# Patient Record
Sex: Female | Born: 1990 | Race: Black or African American | Hispanic: No | Marital: Single | Smoking: Never smoker
Health system: Southern US, Community
[De-identification: ages and names within clinical notes are randomized; demographics above are authoritative.]

---
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
## 2014-01-27 ENCOUNTER — Emergency Department (HOSPITAL_COMMUNITY): Payer: 59

## 2014-01-27 ENCOUNTER — Emergency Department (HOSPITAL_COMMUNITY)
Admission: EM | Admit: 2014-01-27 | Discharge: 2014-01-27 | Disposition: A | Discharge status: Home / Self Care | Payer: 59 | Attending: Emergency Medicine | Admitting: Emergency Medicine

## 2014-01-27 ENCOUNTER — Encounter (HOSPITAL_COMMUNITY): Payer: Self-pay | Admitting: Emergency Medicine

## 2014-01-27 DIAGNOSIS — R0602 Shortness of breath: Secondary | ICD-10-CM | POA: Diagnosis not present

## 2014-01-27 DIAGNOSIS — R0789 Other chest pain: Secondary | ICD-10-CM | POA: Insufficient documentation

## 2014-01-27 DIAGNOSIS — M25569 Pain in unspecified knee: Secondary | ICD-10-CM | POA: Diagnosis not present

## 2014-01-27 DIAGNOSIS — R079 Chest pain, unspecified: Secondary | ICD-10-CM | POA: Insufficient documentation

## 2014-01-27 LAB — I-STAT CHEM 8, ED
BUN: 8 mg/dL (ref 6–23)
Calcium, Ion: 1.24 mmol/L — ABNORMAL HIGH (ref 1.12–1.23)
Chloride: 104 mEq/L (ref 96–112)
Creatinine, Ser: 0.8 mg/dL (ref 0.50–1.10)
Glucose, Bld: 91 mg/dL (ref 70–99)
HCT: 42 % (ref 36.0–46.0)
Hemoglobin: 14.3 g/dL (ref 12.0–15.0)
Potassium: 3.8 mEq/L (ref 3.7–5.3)
Sodium: 141 mEq/L (ref 137–147)
TCO2: 23 mmol/L (ref 0–100)

## 2014-01-27 LAB — D-DIMER, QUANTITATIVE: D-Dimer, Quant: <0.27 ug/mL-FEU (ref 0.00–0.48)

## 2014-01-27 MED ORDER — ACETAMINOPHEN 325 MG PO TABS
975.0000 mg | ORAL_TABLET | Freq: Once | ORAL | Status: AC
  Administered 2014-01-27: 975 mg via ORAL
  Filled 2014-01-27: qty 3

## 2014-01-27 NOTE — ED Provider Notes (Signed)
CSN: [id number]     Arrival date & time 01/27/14  1715 History   First MD Initiated Contact with Patient 01/27/14 1907     Chief Complaint  Patient presents with  . Pain     (Consider location/radiation/quality/duration/timing/severity/associated sxs/prior Treatment) HPI Complaint of anterior chest pain left-sided parasternal pleuritic in quality nonradiating worse with standing improved with sitting down  onset 4 PM today. Associated symptoms include shortness of breath. Also complains of right knee pain anterior nonradiating worse with moving her leg intermittent since February 2015. She treated herself with ibuprofen today with partial relief. Knee pain and chest pain are now mild. No fever. No cough. No other associated symptoms. She denies back pain.  History reviewed. No pertinent past medical history. History reviewed. No pertinent past surgical history. No family history on file. History  Substance Use Topics  . Smoking status: Never Smoker   . Smokeless tobacco: Not on file  . Alcohol Use: Yes     Comment: Occ   no illicit drug OB History   Grav Para Term Preterm Abortions TAB SAB Ect Mult Living                 Review of Systems  Constitutional: Negative.   HENT: Negative.   Respiratory: Positive for shortness of breath.   Cardiovascular: Positive for chest pain.  Gastrointestinal: Negative.   Musculoskeletal: Positive for arthralgias.       Right knee pain  Skin: Negative.   Neurological: Negative.   Psychiatric/Behavioral: Negative.   All other systems reviewed and are negative.     Allergies  Review of patient's allergies indicates no known allergies.  Home Medications   Prior to Admission medications   Medication Sig Start Date End Date Taking? Authorizing Provider  ibuprofen (ADVIL,MOTRIN) 200 MG tablet Take 200 mg by mouth every 6 (six) hours as needed for mild pain or moderate pain.   Yes Historical Provider, MD  medroxyPROGESTERone (DEPO-PROVERA)  150 MG/ML injection  11/04/13  Yes Historical Provider, MD   BP 117/67  Pulse 81  Temp(Src) 97.5 F (36.4 C) (Oral)  Resp 16  Ht 5\' 3"  (1.6 m)  Wt 125 lb (56.7 kg)  BMI 22.15 kg/m2  SpO2 100%  LMP 01/13/2014 Physical Exam  Nursing note and vitals reviewed. Constitutional: She appears well-developed and well-nourished.  HENT:  Head: Normocephalic and atraumatic.  Eyes: Conjunctivae are normal. Pupils are equal, round, and reactive to light.  Neck: Neck supple. No tracheal deviation present. No thyromegaly present.  Cardiovascular: Normal rate, regular rhythm and normal heart sounds.  Exam reveals no friction rub.   No murmur heard. Pulmonary/Chest: Effort normal and breath sounds normal.  Left chest wall is tender, reproducing pain exactly. Chest Pain is also easily reproduced with forcible abduction of left shoulder  Abdominal: Soft. Bowel sounds are normal. She exhibits no distension. There is no tenderness.  Musculoskeletal: Normal range of motion. She exhibits no edema and no tenderness.  All 4 extremities no redness swelling or tenderness neurovascularly intact  Neurological: She is alert. Coordination normal.  Skin: Skin is warm and dry. No rash noted.  Psychiatric: She has a normal mood and affect.    ED Course  Procedures (including critical care time) Labs Review Labs Reviewed - No data to display  Imaging Review Dg Chest 2 View  01/27/2014   CLINICAL DATA:  Chest pain.  EXAM: CHEST  2 VIEW  COMPARISON:  None.  FINDINGS: The heart size and mediastinal contours are within  normal limits. Both lungs are clear. The visualized skeletal structures are unremarkable.  IMPRESSION: Normal chest x-ray.   Electronically Signed   By: Loralie Champagne M.D.   On: 01/27/2014 18:08     EKG Interpretation   Date/Time:  Wednesday January 27 2014 17:41:28 EDT Ventricular Rate:  77 PR Interval:  148 QRS Duration: 86 QT Interval:  364 QTC Calculation: 411 R Axis:   76 Text  Interpretation:  Normal sinus rhythm with sinus arrhythmia Normal ECG  No old tracing to compare Confirmed by Ethelda Chick  MD, Sharmon Cheramie [phone]) on  01/27/2014 7:21:34 PM      Chest xray viewed by me. Results for orders placed during the hospital encounter of 01/27/14  D-DIMER, QUANTITATIVE      Result Value Ref Range   D-Dimer, Quant <0.27  0.00 - 0.48 ug/mL-FEU  I-STAT CHEM 8, ED      Result Value Ref Range   Sodium 141  137 - 147 mEq/L   Potassium 3.8  3.7 - 5.3 mEq/L   Chloride 104  96 - 112 mEq/L   BUN 8  6 - 23 mg/dL   Creatinine, Ser 1.32  0.50 - 1.10 mg/dL   Glucose, Bld 91  70 - 99 mg/dL   Calcium, Ion 4.40 (*) 1.12 - 1.23 mmol/L   TCO2 23  0 - 100 mmol/L   Hemoglobin 14.3  12.0 - 15.0 g/dL   HCT 10.2  72.5 - 36.6 %   Dg Chest 2 View  01/27/2014   CLINICAL DATA:  Chest pain.  EXAM: CHEST  2 VIEW  COMPARISON:  None.  FINDINGS: The heart size and mediastinal contours are within normal limits. Both lungs are clear. The visualized skeletal structures are unremarkable.  IMPRESSION: Normal chest x-ray.   Electronically Signed   By: Loralie Champagne M.D.   On: 01/27/2014 18:08    MDM  Strongly doubt acute coronary syndrome in this young female with highly atypical symptoms and ear normal EKG. Low pretest clinical probability for pulmonary embolism. Negative d-dimer. Well score 0 Knee pain is chronic and anterior not suggestive of DVT. Diagnosis #1 atypical chest pain. Plan tylenol prn pain f/u pmd prn 1 week #2 chronic right knee pain Final diagnoses:  None        Doug Sou, MD 01/27/14 2105

## 2014-01-27 NOTE — Discharge Instructions (Signed)
Chest Pain (Nonspecific) It is safe to take Tylenol as directed for pain. Contact your primary care physician if not better by next week. It is often hard to give a diagnosis for the cause of chest pain. There is always a chance that your pain could be related to something serious, such as a heart attack or a blood clot in the lungs. You need to follow up with your doctor. HOME CARE  If antibiotic medicine was given, take it as directed by your doctor. Finish the medicine even if you start to feel better.  For the next few days, avoid activities that bring on chest pain. Continue physical activities as told by your doctor.  Do not use any tobacco products. This includes cigarettes, chewing tobacco, and e-cigarettes.  Avoid drinking alcohol.  Only take medicine as told by your doctor.  Follow your doctor's suggestions for more testing if your chest pain does not go away.  Keep all doctor visits you made. GET HELP IF:  Your chest pain does not go away, even after treatment.  You have a rash with blisters on your chest.  You have a fever. GET HELP RIGHT AWAY IF:   You have more pain or pain that spreads to your arm, neck, jaw, back, or belly (abdomen).  You have shortness of breath.  You cough more than usual or cough up blood.  You have very bad back or belly pain.  You feel sick to your stomach (nauseous) or throw up (vomit).  You have very bad weakness.  You pass out (faint).  You have chills. This is an emergency. Do not wait to see if the problems will go away. Call your local emergency services (911 in [place] Do not drive yourself to the hospital. MAKE SURE YOU:   Understand these instructions.  Will watch your condition.  Will get help right away if you are not doing well or get worse. Document Released: 11/14/2007 Document Revised: 06/02/2013 Document Reviewed: 11/14/2007 [facility] Patient Information 2015 [place], [place]. This information is not intended to  replace advice given to you by your health care provider. Make sure you discuss any questions you have with your health care provider.

## 2014-01-27 NOTE — ED Notes (Signed)
Pt. Reports intermittent throbbing left chest pain. Pt. Reports pain started while at work. Pt. Reports pain gets worse when standing. Pt. Reports "I had pain like this a year ago and they gave me and inhaler but I don't know where it is anymore"

## 2014-01-27 NOTE — ED Notes (Addendum)
Right knee pain began last night. Right upper back pain and chest discomfort began at 1600 while at work. Pain is worse with deep breathing.. NAD.

## 2018-05-16 ENCOUNTER — Encounter (HOSPITAL_COMMUNITY): Payer: Self-pay

## 2018-05-16 ENCOUNTER — Emergency Department (HOSPITAL_COMMUNITY)
Admission: EM | Admit: 2018-05-16 | Discharge: 2018-05-16 | Disposition: A | Discharge status: Home / Self Care | Payer: Medicaid - Out of State | Attending: Emergency Medicine | Admitting: Emergency Medicine

## 2018-05-16 ENCOUNTER — Emergency Department (HOSPITAL_COMMUNITY): Payer: Medicaid - Out of State

## 2018-05-16 ENCOUNTER — Other Ambulatory Visit: Payer: Self-pay

## 2018-05-16 DIAGNOSIS — M7752 Other enthesopathy of left foot: Secondary | ICD-10-CM

## 2018-05-16 DIAGNOSIS — M25572 Pain in left ankle and joints of left foot: Secondary | ICD-10-CM | POA: Diagnosis present

## 2018-05-16 DIAGNOSIS — M65272 Calcific tendinitis, left ankle and foot: Secondary | ICD-10-CM | POA: Insufficient documentation

## 2018-05-16 MED ORDER — KETOROLAC TROMETHAMINE 10 MG PO TABS
10.0000 mg | ORAL_TABLET | Freq: Once | ORAL | Status: AC
  Administered 2018-05-16: 10 mg via ORAL
  Filled 2018-05-16: qty 1

## 2018-05-16 MED ORDER — TRAMADOL HCL 50 MG PO TABS: ORAL_TABLET | ORAL | 0 refills | Status: AC

## 2018-05-16 MED ORDER — DEXAMETHASONE 4 MG PO TABS: 4.0000 mg | ORAL_TABLET | Freq: Two times a day (BID) | ORAL | 0 refills | Status: AC

## 2018-05-16 MED ORDER — PREDNISONE 20 MG PO TABS
40.0000 mg | ORAL_TABLET | Freq: Once | ORAL | Status: AC
Start: 2018-05-16 — End: 2018-05-16
  Administered 2018-05-16: 40 mg via ORAL
  Filled 2018-05-16: qty 2

## 2018-05-16 MED ORDER — ONDANSETRON HCL 4 MG PO TABS
4.0000 mg | ORAL_TABLET | Freq: Once | ORAL | Status: AC
  Administered 2018-05-16: 4 mg via ORAL
  Filled 2018-05-16: qty 1

## 2018-05-16 MED ORDER — DICLOFENAC SODIUM 75 MG PO TBEC: 75.0000 mg | DELAYED_RELEASE_TABLET | Freq: Two times a day (BID) | ORAL | 0 refills | Status: AC

## 2018-05-16 NOTE — ED Triage Notes (Signed)
Pt has been having left ankle pain for the last 2 months. Has an orthopedist but can't be seen until the 16th. Is here for pain control. Has had xrays done and confirmed she does have a sprain. Has been to multiple urgent cares.

## 2018-05-16 NOTE — Discharge Instructions (Addendum)
Your vital signs are within normal limits.  The x-ray of your foot and ankle were negative for fracture or dislocation.  Your examination suggest inflammation, and possible tendinitis.  Please use your cam walker when up and about until seen by the orthopedic specialist.  Please use Decadron and diclofenac 2 times daily with food.  Use Tylenol extra strength for mild pain, use Ultram for more severe pain. This medication may cause drowsiness. Please do not drink, drive, or participate in activity that requires concentration while taking this medication. See your orthopedic specialist as scheduled.

## 2018-05-16 NOTE — ED Notes (Signed)
Pt speaks of multiple urgent care visits, MRI from ortho Has not seen for results   Here for pain   Has been RXd multiple non narcotic drugs per her report without relief

## 2018-05-16 NOTE — ED Provider Notes (Signed)
[facility] EMERGENCY DEPARTMENT Provider Note   CSN: [id number] Arrival date & time: 05/16/18  1323     History   Chief Complaint Chief Complaint  Patient presents with  . Ankle Pain    HPI Veronica Kline is a 27 y.o. female.  Pt states she was moving and developed left foot and ankle pain.   The history is provided by the patient.  Ankle Pain   Incident onset: 2 months. The incident occurred at home (increase pain when standing at work.). There was no injury mechanism. The pain is present in the left ankle and left foot. The pain is moderate (pain with applying weight). The pain has been constant since onset. Associated symptoms include inability to bear weight. Pertinent negatives include no numbness and no loss of sensation. She reports no foreign bodies present. The symptoms are aggravated by bearing weight. She has tried NSAIDs for the symptoms. The treatment provided no relief.    History reviewed. No pertinent past medical history.  There are no active problems to display for this patient.   History reviewed. No pertinent surgical history.   OB History   None      Home Medications    Prior to Admission medications   Medication Sig Start Date End Date Taking? Authorizing Provider  ibuprofen (ADVIL,MOTRIN) 200 MG tablet Take 200 mg by mouth every 6 (six) hours as needed for mild pain or moderate pain.    [provider]  medroxyPROGESTERone (DEPO-PROVERA) 150 MG/ML injection  11/04/13   [provider]    Family History No family history on file.  Social History Social History   Tobacco Use  . Smoking status: Never Smoker  . Smokeless tobacco: Never Used  Substance Use Topics  . Alcohol use: Yes    Comment: Occ  . Drug use: Not on file     Allergies   Patient has no known allergies.   Review of Systems Review of Systems  Constitutional: Negative for activity change.       All ROS Neg except as noted in HPI  HENT: Negative  for nosebleeds.   Eyes: Negative for photophobia and discharge.  Respiratory: Negative for cough, shortness of breath and wheezing.   Cardiovascular: Negative for chest pain and palpitations.  Gastrointestinal: Negative for abdominal pain and blood in stool.  Genitourinary: Negative for dysuria, frequency and hematuria.  Musculoskeletal: Positive for arthralgias. Negative for back pain and neck pain.  Skin: Negative.   Neurological: Negative for dizziness, seizures, speech difficulty and numbness.  Psychiatric/Behavioral: Negative for confusion and hallucinations.     Physical Exam Updated Vital Signs BP 125/71 (BP Location: Right Arm)   Pulse 79   Temp 97.8 F (36.6 C) (Oral)   Resp 16   Ht 5\' 2"  (1.575 m)   Wt 63.5 kg   LMP 04/22/2018   SpO2 97%   BMI 25.61 kg/m   Physical Exam  Constitutional: She is oriented to person, place, and time. She appears well-developed and well-nourished.  Non-toxic appearance.  HENT:  Head: Normocephalic.  Right Ear: Tympanic membrane and external ear normal.  Left Ear: Tympanic membrane and external ear normal.  Eyes: Pupils are equal, round, and reactive to light. EOM and lids are normal.  Neck: Normal range of motion. Neck supple. Carotid bruit is not present.  Cardiovascular: Normal rate, regular rhythm, normal heart sounds, intact distal pulses and normal pulses.  Pulmonary/Chest: Breath sounds normal. No respiratory distress.  Abdominal: Soft. Bowel sounds are  normal. There is no tenderness. There is no guarding.  Musculoskeletal: Normal range of motion. She exhibits tenderness.       Left ankle: Tenderness. Lateral malleolus and medial malleolus tenderness found.       Feet:  Lymphadenopathy:       Head (right side): No submandibular adenopathy present.       Head (left side): No submandibular adenopathy present.    She has no cervical adenopathy.  Neurological: She is alert and oriented to person, place, and time. She has normal  strength. No cranial nerve deficit or sensory deficit.  Skin: Skin is warm and dry.  Psychiatric: She has a normal mood and affect. Her speech is normal.  Nursing note and vitals reviewed.    ED Treatments / Results  Labs (all labs ordered are listed, but only abnormal results are displayed) Labs Reviewed - No data to display  EKG None  Radiology Dg Ankle Complete Left  Result Date: 05/16/2018 CLINICAL DATA:  Pain and swelling since October 11th after moving. EXAM: LEFT ANKLE COMPLETE - 3+ VIEW; LEFT FOOT - COMPLETE 3+ VIEW COMPARISON:  None. FINDINGS: LEFT foot: There is no evidence of fracture, dislocation, or joint effusion. Bipartite first metatarsal tibial sesamoid, normal variant. There is no evidence of arthropathy or other focal bone abnormality. Soft tissues are unremarkable. LEFT ankle: No fracture deformity nor dislocation. The ankle mortise appears congruent and the tibiofibular syndesmosis intact. No destructive bony lesions. Soft tissue planes are non-suspicious. IMPRESSION: Negative. Electronically Signed   By: Awilda Metroourtnay  Bloomer M.D.   On: 05/16/2018 15:16   Dg Foot Complete Left  Result Date: 05/16/2018 CLINICAL DATA:  Pain and swelling since October 11th after moving. EXAM: LEFT ANKLE COMPLETE - 3+ VIEW; LEFT FOOT - COMPLETE 3+ VIEW COMPARISON:  None. FINDINGS: LEFT foot: There is no evidence of fracture, dislocation, or joint effusion. Bipartite first metatarsal tibial sesamoid, normal variant. There is no evidence of arthropathy or other focal bone abnormality. Soft tissues are unremarkable. LEFT ankle: No fracture deformity nor dislocation. The ankle mortise appears congruent and the tibiofibular syndesmosis intact. No destructive bony lesions. Soft tissue planes are non-suspicious. IMPRESSION: Negative. Electronically Signed   By: Awilda Metroourtnay  Bloomer M.D.   On: 05/16/2018 15:16    Procedures Procedures (including critical care time)  Medications Ordered in  ED Medications - No data to display   Initial Impression / Assessment and Plan / ED Course  I have reviewed the triage vital signs and the nursing notes.  Pertinent labs & imaging results that were available during my care of the patient were reviewed by me and considered in my medical decision making (see chart for details).       Final Clinical Impressions(s) / ED Diagnoses MDM  Vital signs reviewed.  X-ray of the left foot and ankle are negative for fracture or dislocation.  The examination shows some warmth of the lateral and medial malleolus.  There is pain of the lateral and medial malleolus.  No neurovascular deficits appreciated.  Examination suggest tendinitis.  There is no evidence of fracture or dislocation, no septic joint noted on examination.  No neurovascular deficit appreciated.  Patient fitted with a cam walker.  Prescription for Decadron, diclofenac, and Ultram given to the patient.  Patient will follow-up with orthopedics as previously planned.   Final diagnoses:  Tendonitis of ankle, left    ED Discharge Orders         Ordered    dexamethasone (DECADRON) 4 MG tablet  2 times daily with meals     05/16/18 1635    diclofenac (VOLTAREN) 75 MG EC tablet  2 times daily     05/16/18 1636    traMADol (ULTRAM) 50 MG tablet     05/16/18 1636           Ivery Quale, PA-C 05/16/18 1658    Vanetta Mulders, MD 05/17/18 1906

## 2020-06-10 IMAGING — DX DG FOOT COMPLETE 3+V*L*
3 series · 3 of 3 positions shown · non-contrast
Comparison: None.

CLINICAL DATA: Pain and swelling since [REDACTED] after moving.

EXAM:
LEFT ANKLE COMPLETE - 3+ VIEW; LEFT FOOT - COMPLETE 3+ VIEW

[foot ap]
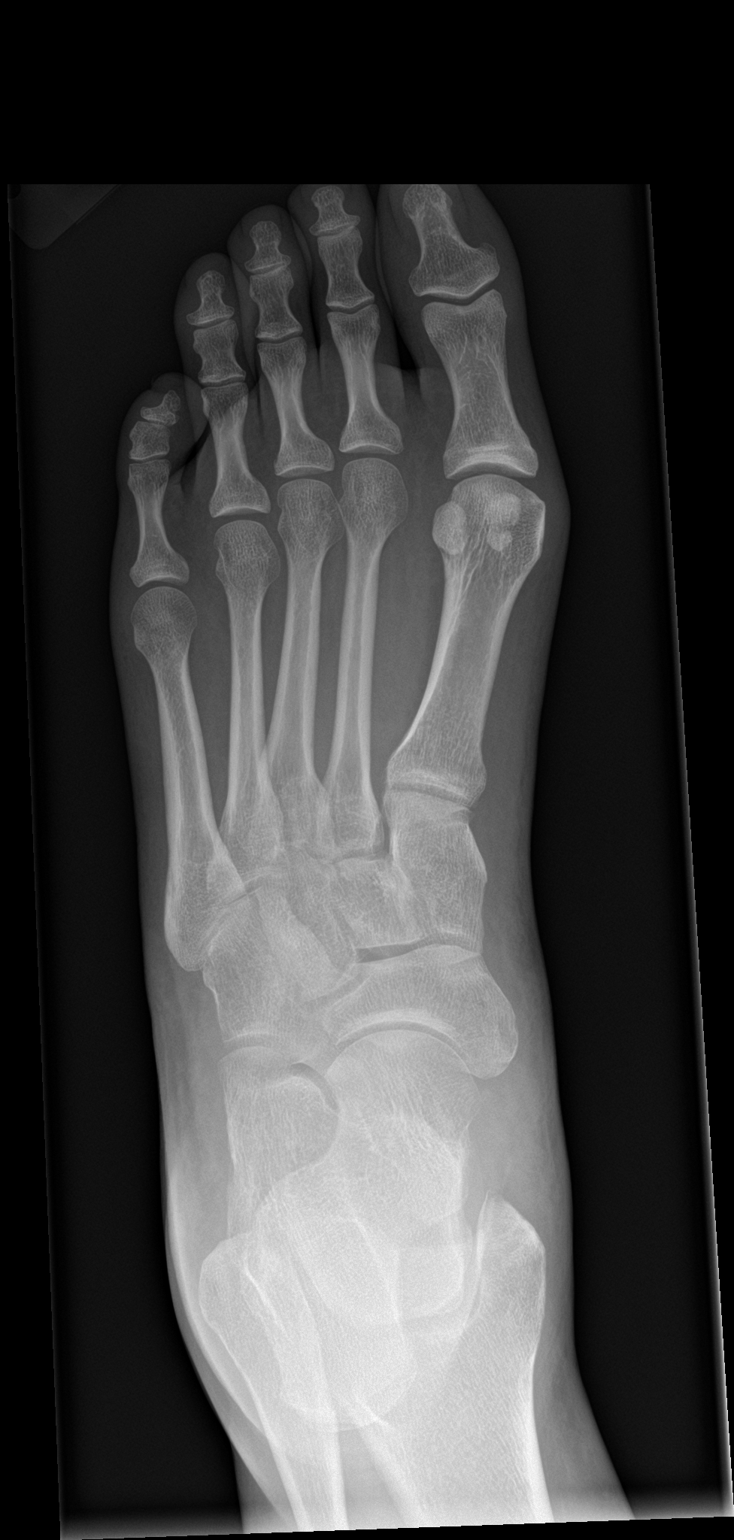

[foot obl]
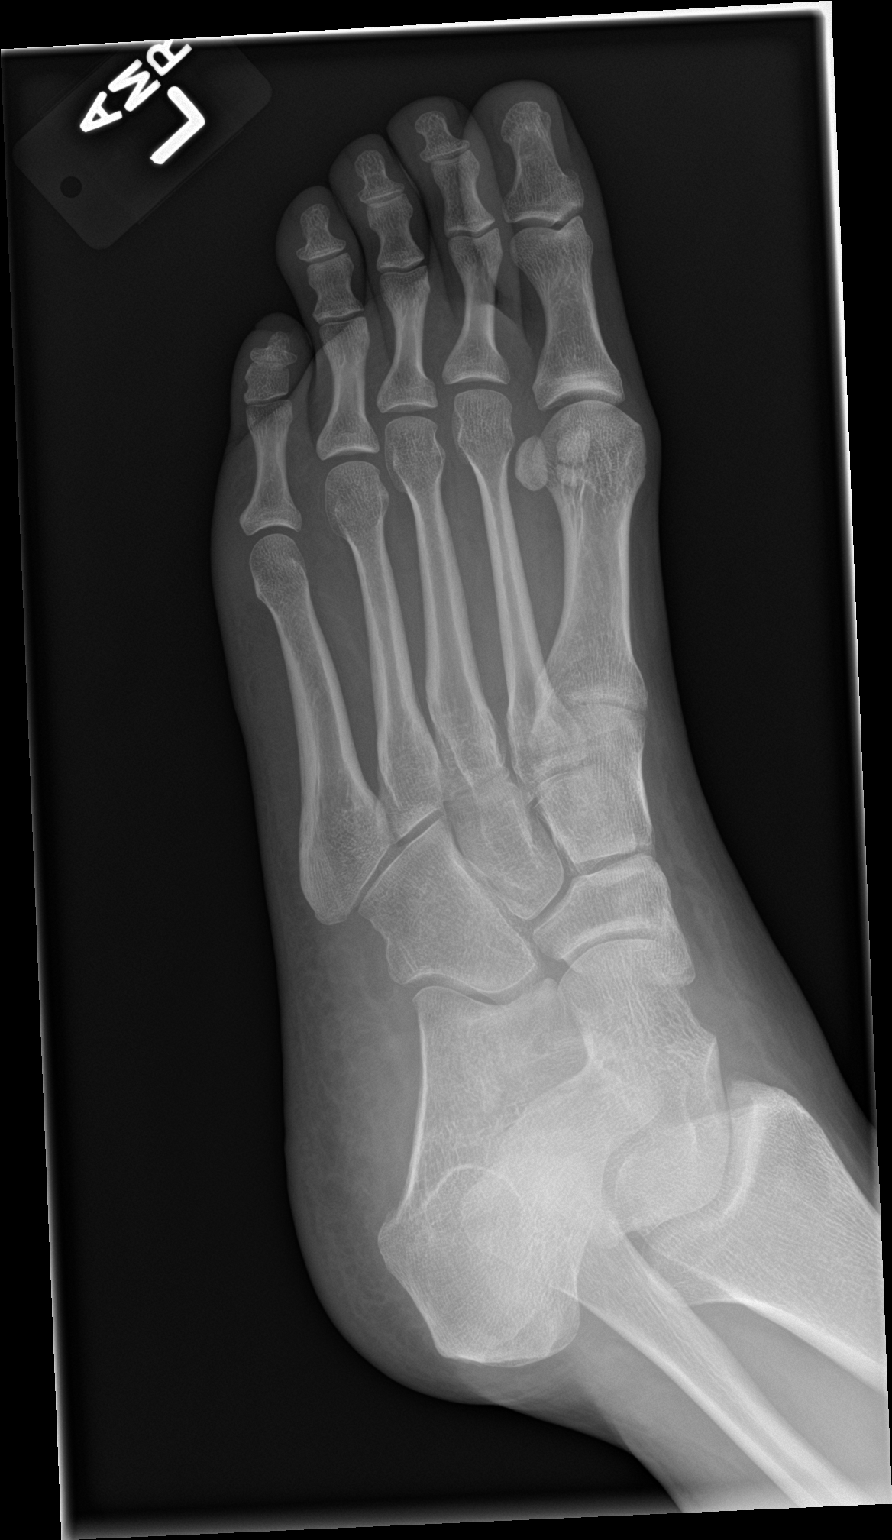

[foot lat]
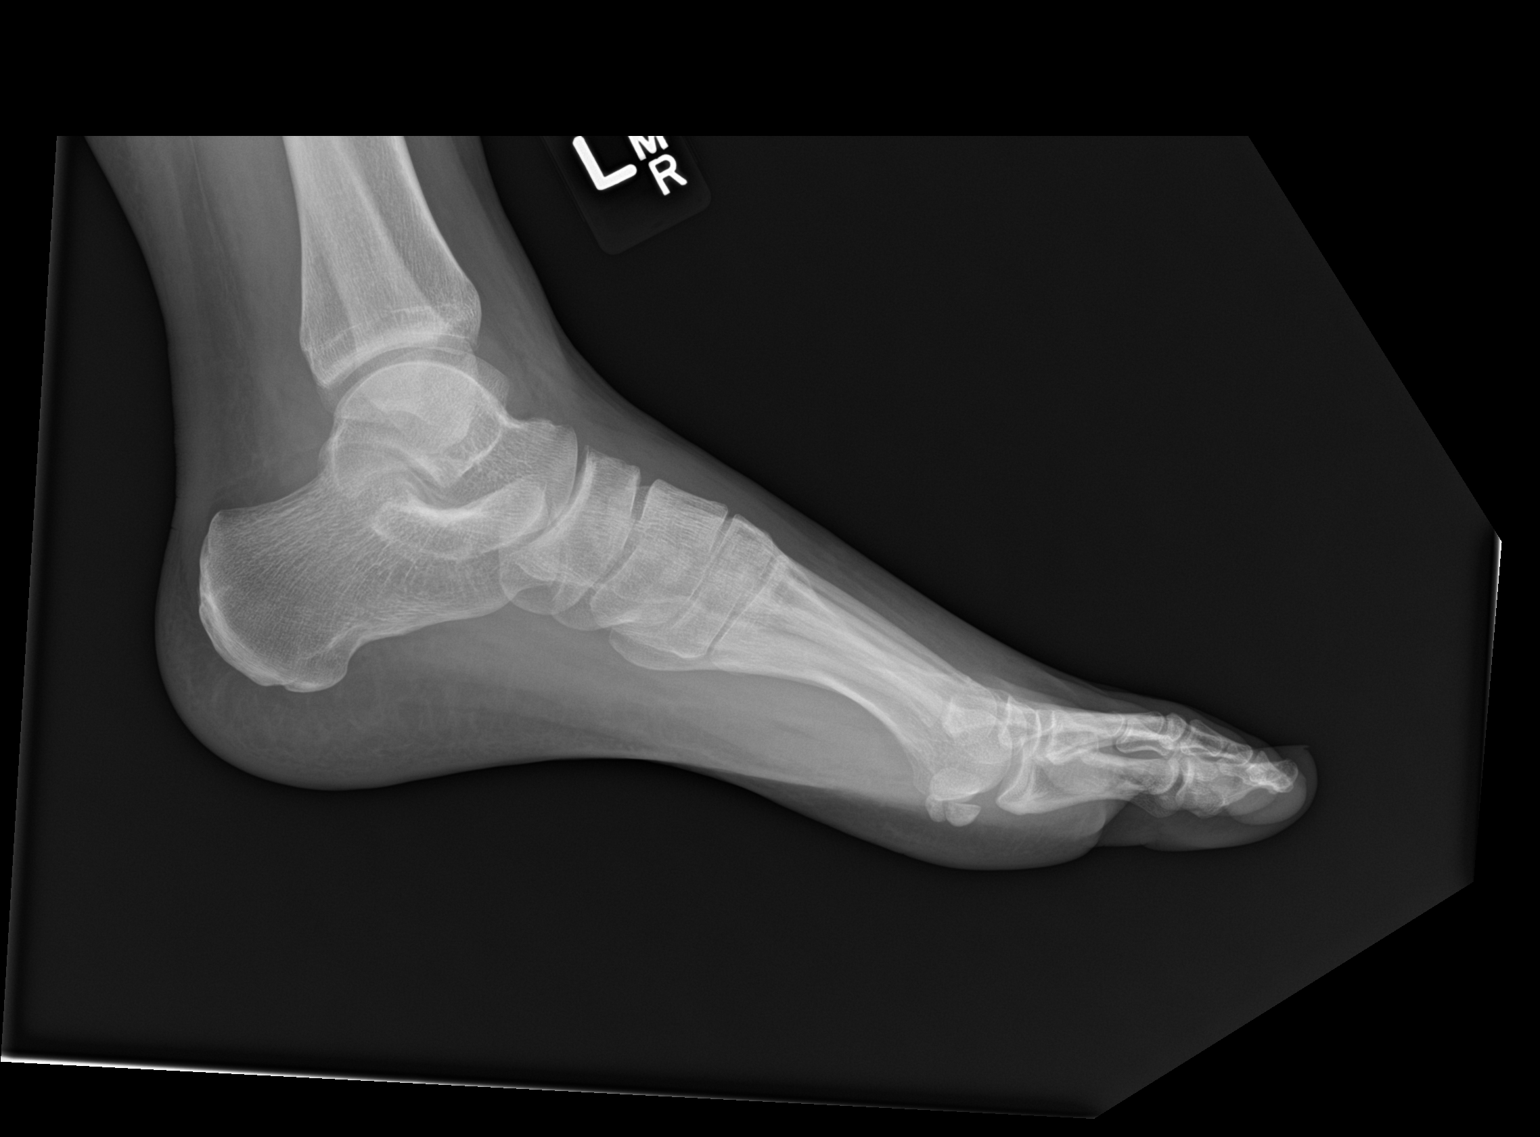

[3 of 3 positions shown; findings below may reference images not displayed]

FINDINGS: LEFT foot: There is no evidence of fracture, dislocation, or joint
effusion. Bipartite first metatarsal tibial sesamoid, normal
variant. There is no evidence of arthropathy or other focal bone
abnormality. Soft tissues are unremarkable.

LEFT ankle: No fracture deformity nor dislocation. The ankle mortise
appears congruent and the tibiofibular syndesmosis intact. No
destructive bony lesions. Soft tissue planes are non-suspicious.
IMPRESSION: Negative.
